# Patient Record
Sex: Male | Born: 1935 | Race: White | Hispanic: No | Marital: Married | Smoking: Former smoker
Health system: Southern US, Community
[De-identification: ages and names within clinical notes are randomized; demographics above are authoritative.]

## PROBLEM LIST (undated history)

## (undated) DIAGNOSIS — R410 Disorientation, unspecified: Secondary | ICD-10-CM

## (undated) DIAGNOSIS — C4491 Basal cell carcinoma of skin, unspecified: Secondary | ICD-10-CM

## (undated) DIAGNOSIS — I10 Essential (primary) hypertension: Secondary | ICD-10-CM

## (undated) DIAGNOSIS — R42 Dizziness and giddiness: Secondary | ICD-10-CM

## (undated) DIAGNOSIS — C439 Malignant melanoma of skin, unspecified: Secondary | ICD-10-CM

## (undated) HISTORY — DX: Disorientation, unspecified: R41.0

## (undated) HISTORY — PX: PROSTATE SURGERY: SHX751

## (undated) HISTORY — PX: HERNIA REPAIR: SHX51

## (undated) HISTORY — PX: APPENDECTOMY: SHX54

## (undated) HISTORY — DX: Malignant melanoma of skin, unspecified: C43.9

## (undated) HISTORY — DX: Basal cell carcinoma of skin, unspecified: C44.91

## (undated) HISTORY — PX: BASAL CELL CARCINOMA EXCISION: SHX1214

## (undated) HISTORY — DX: Dizziness and giddiness: R42

## (undated) HISTORY — PX: MELANOMA EXCISION: SHX5266

---

## 2016-12-28 DIAGNOSIS — R69 Illness, unspecified: Secondary | ICD-10-CM | POA: Diagnosis not present

## 2017-01-10 DIAGNOSIS — R69 Illness, unspecified: Secondary | ICD-10-CM | POA: Diagnosis not present

## 2017-02-02 DIAGNOSIS — R69 Illness, unspecified: Secondary | ICD-10-CM | POA: Diagnosis not present

## 2017-02-08 DIAGNOSIS — R413 Other amnesia: Secondary | ICD-10-CM | POA: Diagnosis not present

## 2017-02-08 DIAGNOSIS — R42 Dizziness and giddiness: Secondary | ICD-10-CM | POA: Diagnosis not present

## 2017-02-08 DIAGNOSIS — Z79899 Other long term (current) drug therapy: Secondary | ICD-10-CM | POA: Diagnosis not present

## 2017-02-08 DIAGNOSIS — R768 Other specified abnormal immunological findings in serum: Secondary | ICD-10-CM | POA: Diagnosis not present

## 2017-02-08 DIAGNOSIS — I10 Essential (primary) hypertension: Secondary | ICD-10-CM | POA: Diagnosis not present

## 2017-02-08 DIAGNOSIS — B356 Tinea cruris: Secondary | ICD-10-CM | POA: Diagnosis not present

## 2017-02-15 DIAGNOSIS — L300 Nummular dermatitis: Secondary | ICD-10-CM | POA: Diagnosis not present

## 2017-02-15 DIAGNOSIS — D485 Neoplasm of uncertain behavior of skin: Secondary | ICD-10-CM | POA: Diagnosis not present

## 2017-02-15 DIAGNOSIS — L821 Other seborrheic keratosis: Secondary | ICD-10-CM | POA: Diagnosis not present

## 2017-02-15 DIAGNOSIS — B356 Tinea cruris: Secondary | ICD-10-CM | POA: Diagnosis not present

## 2017-03-21 DIAGNOSIS — R69 Illness, unspecified: Secondary | ICD-10-CM | POA: Diagnosis not present

## 2017-03-22 DIAGNOSIS — I10 Essential (primary) hypertension: Secondary | ICD-10-CM | POA: Diagnosis not present

## 2017-03-22 DIAGNOSIS — R42 Dizziness and giddiness: Secondary | ICD-10-CM | POA: Diagnosis not present

## 2017-03-22 DIAGNOSIS — R413 Other amnesia: Secondary | ICD-10-CM | POA: Diagnosis not present

## 2017-03-31 DIAGNOSIS — L570 Actinic keratosis: Secondary | ICD-10-CM | POA: Diagnosis not present

## 2017-03-31 DIAGNOSIS — L821 Other seborrheic keratosis: Secondary | ICD-10-CM | POA: Diagnosis not present

## 2017-03-31 DIAGNOSIS — B356 Tinea cruris: Secondary | ICD-10-CM | POA: Diagnosis not present

## 2017-03-31 DIAGNOSIS — L300 Nummular dermatitis: Secondary | ICD-10-CM | POA: Diagnosis not present

## 2017-05-18 DIAGNOSIS — I10 Essential (primary) hypertension: Secondary | ICD-10-CM | POA: Diagnosis not present

## 2017-05-18 DIAGNOSIS — M542 Cervicalgia: Secondary | ICD-10-CM | POA: Diagnosis not present

## 2017-05-18 DIAGNOSIS — R001 Bradycardia, unspecified: Secondary | ICD-10-CM | POA: Diagnosis not present

## 2017-05-18 DIAGNOSIS — R209 Unspecified disturbances of skin sensation: Secondary | ICD-10-CM | POA: Diagnosis not present

## 2017-05-18 DIAGNOSIS — R440 Auditory hallucinations: Secondary | ICD-10-CM | POA: Diagnosis not present

## 2017-05-18 DIAGNOSIS — M25473 Effusion, unspecified ankle: Secondary | ICD-10-CM | POA: Diagnosis not present

## 2017-05-18 DIAGNOSIS — R42 Dizziness and giddiness: Secondary | ICD-10-CM | POA: Diagnosis not present

## 2017-05-18 DIAGNOSIS — Z85820 Personal history of malignant melanoma of skin: Secondary | ICD-10-CM | POA: Diagnosis not present

## 2017-05-18 DIAGNOSIS — R413 Other amnesia: Secondary | ICD-10-CM | POA: Diagnosis not present

## 2017-05-18 DIAGNOSIS — L299 Pruritus, unspecified: Secondary | ICD-10-CM | POA: Diagnosis not present

## 2017-07-04 DIAGNOSIS — R05 Cough: Secondary | ICD-10-CM | POA: Diagnosis not present

## 2017-07-04 DIAGNOSIS — R270 Ataxia, unspecified: Secondary | ICD-10-CM | POA: Diagnosis not present

## 2017-07-04 DIAGNOSIS — B360 Pityriasis versicolor: Secondary | ICD-10-CM | POA: Diagnosis not present

## 2017-07-08 DIAGNOSIS — G459 Transient cerebral ischemic attack, unspecified: Secondary | ICD-10-CM | POA: Diagnosis not present

## 2017-07-08 DIAGNOSIS — R270 Ataxia, unspecified: Secondary | ICD-10-CM | POA: Diagnosis not present

## 2017-07-08 DIAGNOSIS — R42 Dizziness and giddiness: Secondary | ICD-10-CM | POA: Diagnosis not present

## 2017-08-02 DIAGNOSIS — H9313 Tinnitus, bilateral: Secondary | ICD-10-CM | POA: Diagnosis not present

## 2017-08-02 DIAGNOSIS — R42 Dizziness and giddiness: Secondary | ICD-10-CM | POA: Diagnosis not present

## 2017-08-02 DIAGNOSIS — Z87891 Personal history of nicotine dependence: Secondary | ICD-10-CM | POA: Diagnosis not present

## 2017-08-02 DIAGNOSIS — H90A22 Sensorineural hearing loss, unilateral, left ear, with restricted hearing on the contralateral side: Secondary | ICD-10-CM | POA: Diagnosis not present

## 2017-08-08 DIAGNOSIS — B360 Pityriasis versicolor: Secondary | ICD-10-CM | POA: Diagnosis not present

## 2017-08-08 DIAGNOSIS — I10 Essential (primary) hypertension: Secondary | ICD-10-CM | POA: Diagnosis not present

## 2017-09-14 DIAGNOSIS — Z23 Encounter for immunization: Secondary | ICD-10-CM | POA: Diagnosis not present

## 2017-09-14 DIAGNOSIS — B356 Tinea cruris: Secondary | ICD-10-CM | POA: Diagnosis not present

## 2017-09-14 DIAGNOSIS — M7551 Bursitis of right shoulder: Secondary | ICD-10-CM | POA: Diagnosis not present

## 2017-11-29 DIAGNOSIS — R42 Dizziness and giddiness: Secondary | ICD-10-CM | POA: Diagnosis not present

## 2017-11-29 DIAGNOSIS — J40 Bronchitis, not specified as acute or chronic: Secondary | ICD-10-CM | POA: Diagnosis not present

## 2017-11-29 DIAGNOSIS — B889 Infestation, unspecified: Secondary | ICD-10-CM | POA: Diagnosis not present

## 2020-05-21 ENCOUNTER — Encounter (HOSPITAL_COMMUNITY): Payer: Self-pay | Admitting: Emergency Medicine

## 2020-05-21 ENCOUNTER — Emergency Department (HOSPITAL_COMMUNITY): Payer: Medicare HMO

## 2020-05-21 ENCOUNTER — Emergency Department (HOSPITAL_COMMUNITY)
Admission: EM | Admit: 2020-05-21 | Discharge: 2020-05-22 | Disposition: A | Discharge status: Home / Self Care | Payer: Medicare HMO | Attending: Emergency Medicine | Admitting: Emergency Medicine

## 2020-05-21 ENCOUNTER — Other Ambulatory Visit: Payer: Self-pay

## 2020-05-21 DIAGNOSIS — R918 Other nonspecific abnormal finding of lung field: Secondary | ICD-10-CM | POA: Insufficient documentation

## 2020-05-21 DIAGNOSIS — R42 Dizziness and giddiness: Secondary | ICD-10-CM | POA: Insufficient documentation

## 2020-05-21 DIAGNOSIS — R410 Disorientation, unspecified: Secondary | ICD-10-CM | POA: Insufficient documentation

## 2020-05-21 HISTORY — DX: Essential (primary) hypertension: I10

## 2020-05-21 LAB — COMPREHENSIVE METABOLIC PANEL
ALT: 24 U/L (ref 0–44)
AST: 28 U/L (ref 15–41)
Albumin: 3.7 g/dL (ref 3.5–5.0)
Alkaline Phosphatase: 72 U/L (ref 38–126)
Anion gap: 8 (ref 5–15)
BUN: 19 mg/dL (ref 8–23)
CO2: 24 mmol/L (ref 22–32)
Calcium: 8.8 mg/dL — ABNORMAL LOW (ref 8.9–10.3)
Chloride: 108 mmol/L (ref 98–111)
Creatinine, Ser: 1.6 mg/dL — ABNORMAL HIGH (ref 0.61–1.24)
GFR calc Af Amer: 45 mL/min — ABNORMAL LOW (ref >60)
GFR calc non Af Amer: 38 mL/min — ABNORMAL LOW (ref >60)
Glucose, Bld: 111 mg/dL — ABNORMAL HIGH (ref 70–99)
Potassium: 3.6 mmol/L (ref 3.5–5.1)
Sodium: 140 mmol/L (ref 135–145)
Total Bilirubin: 0.6 mg/dL (ref 0.3–1.2)
Total Protein: 6.5 g/dL (ref 6.5–8.1)

## 2020-05-21 LAB — CBC
HCT: 49.1 % (ref 39.0–52.0)
Hemoglobin: 15.3 g/dL (ref 13.0–17.0)
MCH: 29 pg (ref 26.0–34.0)
MCHC: 31.2 g/dL (ref 30.0–36.0)
MCV: 93 fL (ref 80.0–100.0)
Platelets: 251 10*3/uL (ref 150–400)
RBC: 5.28 MIL/uL (ref 4.22–5.81)
RDW: 14.7 % (ref 11.5–15.5)
WBC: 9.8 10*3/uL (ref 4.0–10.5)
nRBC: 0 % (ref 0.0–0.2)

## 2020-05-21 MED ORDER — IOHEXOL 350 MG/ML SOLN
60.0000 mL | Freq: Once | INTRAVENOUS | Status: AC | PRN
  Administered 2020-05-21: 60 mL via INTRAVENOUS

## 2020-05-21 MED ORDER — SODIUM CHLORIDE 0.9% FLUSH
3.0000 mL | Freq: Once | INTRAVENOUS | Status: AC
  Administered 2020-05-21: 3 mL via INTRAVENOUS

## 2020-05-21 MED ORDER — LORAZEPAM 2 MG/ML IJ SOLN
0.5000 mg | Freq: Once | INTRAMUSCULAR | Status: AC
  Administered 2020-05-22: 0.5 mg via INTRAVENOUS
  Filled 2020-05-21: qty 1

## 2020-05-21 NOTE — ED Provider Notes (Signed)
11:50 PM Assumed care from Dr. Maryan Rued, please see their note for full history, physical and decision making until this point. In brief this is a 86 y.o. year old male who presented to the ED tonight with Dizziness and Altered Mental Status     Intermittent spells of 'staring off into space'. Pending MRI for stroke. If negative, could it be seizures with missing brain? D/w neurology.  MRI as below, d/w neurology who stated this could be a seizure activity versus amyloid spells.  Did not think that the subacute area anything to do with in order to require admission to the hospital.  Patient started on Keppra per their recommendations with outpatient neurology follow-up.  Discussed this with the patient and the daughter and they are very appreciative of the care given here in the emergency room.  Discharge instructions, including strict return precautions for new or worsening symptoms, given. Patient and/or family verbalized understanding and agreement with the plan as described.   Labs, studies and imaging reviewed by myself and considered in medical decision making if ordered. Imaging interpreted by radiology.  Labs Reviewed  COMPREHENSIVE METABOLIC PANEL - Abnormal; Notable for the following components:      Result Value   Glucose, Bld 111 (*)    Creatinine, Ser 1.60 (*)    Calcium 8.8 (*)    GFR calc non Af Amer 38 (*)    GFR calc Af Amer 45 (*)    All other components within normal limits  CBC    CT Angio Head W or Wo Contrast  Final Result    CT Angio Neck W and/or Wo Contrast  Final Result    MR BRAIN WO CONTRAST    (Results Pending)  DG Chest Port 1 View    (Results Pending)    No follow-ups on file.    Sheryl Saintil, Corene Cornea, MD 05/22/20 [phone]

## 2020-05-21 NOTE — ED Provider Notes (Signed)
[facility]PART[facility] Provider Note   CSN: [id number] Arrival date & time: 05/21/20  1740     History Chief Complaint  Patient presents with  . Dizziness  . Altered Mental Status    Tyler Hardin is a 86 y.o. male.  Patient is an 86 year old male with hx of atrial fibrillation not on anticoagulation, hearing loss, HTN, melanoma and sleep apnea who is presenting today at the request of his PCP for intermittent episodes of confusion.  Patient is present with his daughter who gives most of the history.  She reports for the last 1 week he has had intermittent episodes of confusion.  He reports he cannot remember where he is at and cannot remember details that he would normally be able to remember.  This causes him to be very frustrated and it usually lasts no longer than 30 minutes.  He has had up to 3 episodes in 1 day but reports that they are starting to become more frequent.  The longest episode has been 30 minutes and the shortest has been 5 to 10 minutes.  During these episodes daughter denies ever seeing him have any slurred speech, facial droop, unilateral weakness or numbness.  However patient states this week he is also noticed severe vertigo and sometimes difficulty walking.  Patient reports that he has dealt with vertigo for up to 20 years and there are times during his vertigo he is unable to walk and unable to write and sometimes even has to close his eyes.  He has had multiple work-ups for this and nobody has ever been able to help him and make the symptoms go away.  He reports the vertigo he is experienced this week seems to be similar to past episodes.  He denies any recent medication changes.  No recent illness with cough, congestion, shortness of breath, chest pain, palpitations, abdominal pain, nausea, vomiting or diarrhea.  His diet has been the same.  Patient's last MRI was in November of last year which showed no acute changes.  He had a urine done in  the doctor's office today that showed no evidence of infection.  The history is provided by the patient and a relative.  Dizziness Quality:  Imbalance and vertigo Severity:  Moderate Onset quality:  Gradual Duration: states he has had vertigo for 10-20 years but seems worse this week with the episodes of confusion. Timing:  Intermittent Progression:  Worsening Chronicity:  Recurrent Associated symptoms: no chest pain, no diarrhea, no headaches, no nausea, no palpitations, no shortness of breath, no syncope, no vision changes, no vomiting and no weakness   Risk factors: hx of stroke and hx of vertigo   Risk factors comment:  Htn Altered Mental Status Associated symptoms: no headaches, no nausea, no palpitations, no visual change, no vomiting and no weakness        Past Medical History:  Diagnosis Date  . Hypertension     There are no problems to display for this patient.        No family history on file.  Social History   Tobacco Use  . Smoking status: Not on file  Substance Use Topics  . Alcohol use: Not on file  . Drug use: Not on file    Home Medications Prior to Admission medications   Not on File    Allergies    Patient has no known allergies.  Review of Systems   Review of Systems  Respiratory: Negative for shortness of  breath.   Cardiovascular: Negative for chest pain, palpitations and syncope.  Gastrointestinal: Negative for diarrhea, nausea and vomiting.  Neurological: Positive for dizziness. Negative for weakness and headaches.  All other systems reviewed and are negative.   Physical Exam Updated Vital Signs BP (!) 134/94 (BP Location: Right Arm)   Pulse 69   Temp 98.1 F (36.7 C) (Oral)   Resp 14   SpO2 96%   Physical Exam Vitals and nursing note reviewed.  Constitutional:      General: He is not in acute distress.    Appearance: Normal appearance. He is well-developed and normal weight.  HENT:     Head: Normocephalic and  atraumatic.     Nose: Nose normal.     Mouth/Throat:     Mouth: Mucous membranes are moist.  Eyes:     General: No visual field deficit.    Extraocular Movements: Extraocular movements intact.     Conjunctiva/sclera: Conjunctivae normal.     Pupils: Pupils are equal, round, and reactive to light.     Comments: No nystagmus  Cardiovascular:     Rate and Rhythm: Normal rate. Rhythm irregularly irregular.     Pulses: Normal pulses.     Heart sounds: No murmur.  Pulmonary:     Effort: Pulmonary effort is normal. No respiratory distress.     Breath sounds: Normal breath sounds. No wheezing or rales.  Abdominal:     General: There is no distension.     Palpations: Abdomen is soft.     Tenderness: There is no abdominal tenderness. There is no guarding or rebound.  Musculoskeletal:        General: No tenderness. Normal range of motion.     Cervical back: Normal range of motion and neck supple.     Right lower leg: No edema.     Left lower leg: No edema.  Skin:    General: Skin is warm and dry.     Findings: No erythema or rash.     Comments: All areas of ecchymosis over the arms in various stages of healing  Neurological:     Mental Status: He is alert and oriented to person, place, and time. Mental status is at baseline.     Cranial Nerves: No dysarthria or facial asymmetry.     Sensory: Sensation is intact.     Motor: Motor function is intact. No weakness or pronator drift.     Coordination: Coordination is intact. Coordination normal. Finger-Nose-Finger Test and Heel to Shin Test normal.     Comments: Slightly unsteady with walking but is able to self-correct  Psychiatric:        Mood and Affect: Mood normal.        Behavior: Behavior normal.        Thought Content: Thought content normal.     ED Results / Procedures / Treatments   Labs (all labs ordered are listed, but only abnormal results are displayed) Labs Reviewed  COMPREHENSIVE METABOLIC PANEL - Abnormal; Notable for  the following components:      Result Value   Glucose, Bld 111 (*)    Creatinine, Ser 1.60 (*)    Calcium 8.8 (*)    GFR calc non Af Amer 38 (*)    GFR calc Af Amer 45 (*)    All other components within normal limits  CBC    EKG EKG Interpretation  Date/Time:  Wednesday May 21 2020 17:46:49 EDT Ventricular Rate:  69 PR Interval:  QRS Duration: 90 QT Interval:  378 QTC Calculation: 405 R Axis:   97 Text Interpretation: Atrial fibrillation Rightward axis Low voltage QRS Septal infarct , age undetermined No previous tracing Confirmed by Blanchie Dessert [phone]) on 05/21/2020 7:04:54 PM   Radiology CT Angio Head W or Wo Contrast  Result Date: 05/21/2020 CLINICAL DATA:  Initial evaluation for acute vertigo. EXAM: CT ANGIOGRAPHY HEAD AND NECK TECHNIQUE: Multidetector CT imaging of the head and neck was performed using the standard protocol during bolus administration of intravenous contrast. Multiplanar CT image reconstructions and MIPs were obtained to evaluate the vascular anatomy. Carotid stenosis measurements (when applicable) are obtained utilizing NASCET criteria, using the distal internal carotid diameter as the denominator. CONTRAST:  9mL OMNIPAQUE IOHEXOL 350 MG/ML SOLN COMPARISON:  Prior MRI from 07/08/2017. FINDINGS: CT HEAD FINDINGS Brain: Generalized age-related cerebral atrophy with mild chronic small vessel ischemic disease. Remote lacunar infarct present at the right basal ganglia. Encephalomalacia of involving the left occipital lobe compatible with a chronic left PCA territory infarct. No acute intracranial hemorrhage. No acute large vessel territory infarct. No mass lesion, midline shift or mass effect. Mild ventricular prominence related global parenchymal volume loss without hydrocephalus. No extra-axial fluid collection. Vascular: No hyperdense vessel. Calcified atherosclerosis present at skull base. Skull: Scalp soft tissues within normal limits.  Calvarium intact.  Sinuses: Paranasal sinuses are largely clear. No mastoid effusion. Orbits: Globes and orbital soft tissues within normal limits. Review of the MIP images confirms the above findings CTA NECK FINDINGS Aortic arch: Visualized aortic arch within normal limits for caliber with normal branch pattern. Moderate atheromatous change within the arch itself and about the origin of the great vessels without hemodynamically significant stenosis. Visualized subclavian arteries widely patent. Right carotid system: Right CCA tortuous proximally but widely patent to the bifurcation without stenosis. Bulky calcified plaque about the right bifurcation without hemodynamically significant stenosis. Right ICA mildly tortuous but widely patent distally to the skull base without stenosis, dissection or occlusion. Left carotid system: Left CCA tortuous proximally but widely patent to the bifurcation. Mild to moderate atheromatous plaque about the left bifurcation/proximal left ICA without significant stenosis. Left ICA widely patent distally without stenosis, dissection or occlusion. Vertebral arteries: Both vertebral arteries arise from the subclavian arteries. Vertebral arteries widely patent within the neck without stenosis, dissection or occlusion. Skeleton: No acute osseous abnormality. No discrete or worrisome osseous lesions. Mild-to-moderate multilevel cervical spondylosis without high-grade stenosis. Other neck: No other acute soft tissue abnormality within the neck. No mass lesion or adenopathy. Upper chest: Irregular consolidative opacity seen at the posterior left upper lobe, concerning for pneumonia (series 9, image 161). Visualized upper chest demonstrates no other acute finding. Review of the MIP images confirms the above findings CTA HEAD FINDINGS Anterior circulation: Petrous segments widely patent. Mild atheromatous change within the cavernous/supraclinoid ICAs bilaterally. Associated mild to moderate stenoses seen  involving the supraclinoid ICAs bilaterally, right slightly worse than left. A1 segments widely patent. Normal anterior communicating artery complex. Anterior cerebral arteries widely patent to their distal aspects without stenosis. No M1 stenosis or occlusion. Normal MCA bifurcations. Distal MCA branches well perfused and symmetric. Posterior circulation: Mild scattered non stenotic plaque noted within the V4 segments bilaterally without stenosis. Both picas patent. Basilar somewhat dolichoectatic and widely patent to its distal aspect without stenosis. Superior cerebral arteries patent bilaterally. Both PCAs primarily supplied via the basilar. Mild atheromatous irregularity within the PCAs bilaterally without high-grade stenosis. Venous sinuses: Grossly patent allowing for timing the contrast bolus.  Anatomic variants: None significant.  No intracranial aneurysm. Review of the MIP images confirms the above findings IMPRESSION: CT HEAD IMPRESSION: 1. No acute intracranial abnormality. 2. Chronic left PCA territory infarct, with additional remote lacunar infarct involving the right basal ganglia. 3. Underlying moderately advanced age-related cerebral atrophy with chronic small vessel ischemic disease. CTA HEAD AND NECK IMPRESSION: 1. Negative CTA for large vessel occlusion or other acute vascular abnormality. 2. Mild atheromatous change about the carotid siphons with associated mild to moderate stenoses, right slightly worse than left. 3. Additional mild for age atheromatous plaque elsewhere about the major arterial vasculature of the head and neck. No other hemodynamically significant or correctable stenosis. 4. Mild vertebrobasilar dolichoectasia. 5. Irregular left upper lobe infiltrate, concerning for pneumonia. Correlation with plain film radiography suggested. Electronically Signed   By: Jeannine Boga M.D.   On: 05/21/2020 23:06   CT Angio Neck W and/or Wo Contrast  Result Date: 05/21/2020 CLINICAL  DATA:  Initial evaluation for acute vertigo. EXAM: CT ANGIOGRAPHY HEAD AND NECK TECHNIQUE: Multidetector CT imaging of the head and neck was performed using the standard protocol during bolus administration of intravenous contrast. Multiplanar CT image reconstructions and MIPs were obtained to evaluate the vascular anatomy. Carotid stenosis measurements (when applicable) are obtained utilizing NASCET criteria, using the distal internal carotid diameter as the denominator. CONTRAST:  4mL OMNIPAQUE IOHEXOL 350 MG/ML SOLN COMPARISON:  Prior MRI from 07/08/2017. FINDINGS: CT HEAD FINDINGS Brain: Generalized age-related cerebral atrophy with mild chronic small vessel ischemic disease. Remote lacunar infarct present at the right basal ganglia. Encephalomalacia of involving the left occipital lobe compatible with a chronic left PCA territory infarct. No acute intracranial hemorrhage. No acute large vessel territory infarct. No mass lesion, midline shift or mass effect. Mild ventricular prominence related global parenchymal volume loss without hydrocephalus. No extra-axial fluid collection. Vascular: No hyperdense vessel. Calcified atherosclerosis present at skull base. Skull: Scalp soft tissues within normal limits.  Calvarium intact. Sinuses: Paranasal sinuses are largely clear. No mastoid effusion. Orbits: Globes and orbital soft tissues within normal limits. Review of the MIP images confirms the above findings CTA NECK FINDINGS Aortic arch: Visualized aortic arch within normal limits for caliber with normal branch pattern. Moderate atheromatous change within the arch itself and about the origin of the great vessels without hemodynamically significant stenosis. Visualized subclavian arteries widely patent. Right carotid system: Right CCA tortuous proximally but widely patent to the bifurcation without stenosis. Bulky calcified plaque about the right bifurcation without hemodynamically significant stenosis. Right ICA  mildly tortuous but widely patent distally to the skull base without stenosis, dissection or occlusion. Left carotid system: Left CCA tortuous proximally but widely patent to the bifurcation. Mild to moderate atheromatous plaque about the left bifurcation/proximal left ICA without significant stenosis. Left ICA widely patent distally without stenosis, dissection or occlusion. Vertebral arteries: Both vertebral arteries arise from the subclavian arteries. Vertebral arteries widely patent within the neck without stenosis, dissection or occlusion. Skeleton: No acute osseous abnormality. No discrete or worrisome osseous lesions. Mild-to-moderate multilevel cervical spondylosis without high-grade stenosis. Other neck: No other acute soft tissue abnormality within the neck. No mass lesion or adenopathy. Upper chest: Irregular consolidative opacity seen at the posterior left upper lobe, concerning for pneumonia (series 9, image 161). Visualized upper chest demonstrates no other acute finding. Review of the MIP images confirms the above findings CTA HEAD FINDINGS Anterior circulation: Petrous segments widely patent. Mild atheromatous change within the cavernous/supraclinoid ICAs bilaterally. Associated mild to moderate stenoses seen  involving the supraclinoid ICAs bilaterally, right slightly worse than left. A1 segments widely patent. Normal anterior communicating artery complex. Anterior cerebral arteries widely patent to their distal aspects without stenosis. No M1 stenosis or occlusion. Normal MCA bifurcations. Distal MCA branches well perfused and symmetric. Posterior circulation: Mild scattered non stenotic plaque noted within the V4 segments bilaterally without stenosis. Both picas patent. Basilar somewhat dolichoectatic and widely patent to its distal aspect without stenosis. Superior cerebral arteries patent bilaterally. Both PCAs primarily supplied via the basilar. Mild atheromatous irregularity within the PCAs  bilaterally without high-grade stenosis. Venous sinuses: Grossly patent allowing for timing the contrast bolus. Anatomic variants: None significant.  No intracranial aneurysm. Review of the MIP images confirms the above findings IMPRESSION: CT HEAD IMPRESSION: 1. No acute intracranial abnormality. 2. Chronic left PCA territory infarct, with additional remote lacunar infarct involving the right basal ganglia. 3. Underlying moderately advanced age-related cerebral atrophy with chronic small vessel ischemic disease. CTA HEAD AND NECK IMPRESSION: 1. Negative CTA for large vessel occlusion or other acute vascular abnormality. 2. Mild atheromatous change about the carotid siphons with associated mild to moderate stenoses, right slightly worse than left. 3. Additional mild for age atheromatous plaque elsewhere about the major arterial vasculature of the head and neck. No other hemodynamically significant or correctable stenosis. 4. Mild vertebrobasilar dolichoectasia. 5. Irregular left upper lobe infiltrate, concerning for pneumonia. Correlation with plain film radiography suggested. Electronically Signed   By: Jeannine Boga M.D.   On: 05/21/2020 23:06    Procedures Procedures (including critical care time)  Medications Ordered in ED Medications  sodium chloride flush (NS) 0.9 % injection 3 mL (has no administration in time range)    ED Course  I have reviewed the triage vital signs and the nursing notes.  Pertinent labs & imaging results that were available during my care of the patient were reviewed by me and considered in my medical decision making (see chart for details).    MDM Rules/Calculators/A&P                      Elderly male presenting today with 1 week of intermittent episodes of confusion as well as worsening symptoms of vertigo.  Patient was seen by his PCP today who was concerned and sent him here for further evaluation.  Patient is currently asymptomatic with a relatively normal  neuro exam except for mild unsteady gait that he can self-correct for.  However concern for possible TIA as patient symptoms have become more frequent.  Does not appear to be hallucinating or having episodes of delirium concerning for encephalopathy.  He has had no recent medication changes and denies any alcohol or drug use.  Patient does have atrial fibrillation and is not anticoagulated due to the cost of medication causing him to be at much higher stroke risk.  He is currently in atrial fibrillation today rate controlled.  Labs show a creatinine of 1.6 which is baseline compared to readings from outside our facility in the past.  We will get a CTA of the head and neck for further evaluation.  11:37 PM CT was negative for large vessel occlusion.  It did show some mild changes but no significant stenosis.  Patient did show irregular left upper lobe infiltrate concerning for pneumonia.  We will do a chest x-ray to confirm.  Patient's head CT showed chronic left PCA infarct with remote lacunar infarct and moderately advanced age-related cerebral atrophy.  Given patient's new symptoms we will  do MRI to rule out acute infarct.  However speaking with the patient and his daughter during these episodes he occasionally is staring off into space and concerned that may be he is having intermittent partial complex seizures.  Will discuss patient and MRI findings with neurology for further recommendations.  Final Clinical Impression(s) / ED Diagnoses Final diagnoses:  None    Rx / DC Orders ED Discharge Orders    None       Blanchie Dessert, MD 05/21/20 2338

## 2020-05-21 NOTE — ED Triage Notes (Signed)
Pt arrives to ED after seeing PCP due to episodes of confusion that started last week, pt had moments where he did not recognize family members. At this time pt is alert and ox4. Pt states he has a hx of vertigo and has been dizzy for the last 3 days. No weakness or numbness.

## 2020-05-21 NOTE — ED Notes (Signed)
Pt transported to CT ?

## 2020-05-22 ENCOUNTER — Emergency Department (HOSPITAL_COMMUNITY): Payer: Medicare HMO

## 2020-05-22 MED ORDER — MECLIZINE HCL 25 MG PO TABS
25.0000 mg | ORAL_TABLET | Freq: Three times a day (TID) | ORAL | 0 refills | Status: AC | PRN
Start: 2020-05-22 — End: ?

## 2020-05-22 MED ORDER — LEVETIRACETAM 500 MG PO TABS
500.0000 mg | ORAL_TABLET | Freq: Two times a day (BID) | ORAL | 0 refills | Status: DC
Start: 2020-05-22 — End: 2020-06-04

## 2020-05-22 MED ORDER — LEVETIRACETAM 500 MG PO TABS
1000.0000 mg | ORAL_TABLET | Freq: Once | ORAL | Status: AC
  Administered 2020-05-22: 1000 mg via ORAL
  Filled 2020-05-22: qty 2

## 2020-05-22 NOTE — ED Notes (Signed)
Family at bedside. 

## 2020-05-22 NOTE — Discharge Instructions (Addendum)
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 Please follow up with neurology (should call with appointment) for further management.

## 2020-06-04 ENCOUNTER — Encounter: Payer: Self-pay | Admitting: Neurology

## 2020-06-04 ENCOUNTER — Other Ambulatory Visit: Payer: Self-pay

## 2020-06-04 ENCOUNTER — Ambulatory Visit: Payer: Medicare HMO | Admitting: Neurology

## 2020-06-04 VITALS — BP 132/88 | HR 69 | Ht 68.0 in | Wt 203.5 lb

## 2020-06-04 DIAGNOSIS — R269 Unspecified abnormalities of gait and mobility: Secondary | ICD-10-CM | POA: Insufficient documentation

## 2020-06-04 DIAGNOSIS — G40209 Localization-related (focal) (partial) symptomatic epilepsy and epileptic syndromes with complex partial seizures, not intractable, without status epilepticus: Secondary | ICD-10-CM | POA: Insufficient documentation

## 2020-06-04 DIAGNOSIS — I639 Cerebral infarction, unspecified: Secondary | ICD-10-CM | POA: Insufficient documentation

## 2020-06-04 MED ORDER — LEVETIRACETAM 500 MG PO TABS: 500.0000 mg | ORAL_TABLET | Freq: Two times a day (BID) | ORAL | 4 refills | Status: AC

## 2020-06-04 NOTE — Progress Notes (Signed)
PATIENT: Tyler Hardin DOB: [birth year]  Chief Complaint  Patient presents with  . Altered Mental Status    He is here with his daughter, Tyler Hardin. Reports intermittent confusion episodes that last 30 minutes to half day. Denies memory loss. Symptoms started one week prior to an ED visit on 05/21/20. He was given Keppra but wanted to hold off starting it until seen here. He would like to review his abnormal MRI findings. He currently is on antibiotic for pneumonia. Hx of vertgo for 20+ years.  Marland Kitchen PCP    Tyler Broker, MD     [place] is a 86 year old male is accompanied by his daughter Tyler Hardin seen in request by his primary care physician Dr. Herbie Hardin droppings for evaluation of intermittent confusion, initial evaluation was on June 04, 2020  I reviewed and summarized the referring note.  He had past medical history of hypertension, also had a history of right arm melanoma surgery in the past, history of atrial fibrillation, not on any anticoagulation, hearing loss,  He presented to the emergency room May 21, 2020 for intermittent confusion, his daughter noticed that since end of May, a week prior to his ED presentation, he was noted to have intermittent confusion spells, patient also reported that he seems to notice a slip of memory, which is worsening that his baseline  Patient is a retired Armed forces operational officer, used to enjoy motorcycle, he was highly function, very active, but since February 2020, he had prostate surgery he was noted to have slow decline,  At times he woke up from his dream, he cannot tell reality from his dream, this week he woke up from overnight sleep, he could not recognize his wife, very confused, lasting for 30 minutes, then gradually recovered to his baseline  He used to enjoy flying airplane, but few years ago, while he was flying, he had sudden onset mild confusion spells, could not find his way out, he has to give it up  Since 2019, he was noted to  have mild unsteady gait, tends to bump things into left side of his body, he also began to have vivid dreams, often have visual hallucinations, see animals, they do not talk, he does move a lot during his sleep, he denied loss sense of smell, he has no incontinence, has good appetite,  I personally reviewed MRI of the brain on May 22, 2020, left PCA infarction with additional remote left cerebellar infarction, generalized atrophy, chronic microhemorrhage scattered through bilateral hemisphere  REVIEW OF SYSTEMS: Full 14 system review of systems performed and notable only for as above All other review of systems were negative.  ALLERGIES: No Known Allergies  HOME MEDICATIONS: Current Outpatient Medications  Medication Sig Dispense Refill  . amLODipine (NORVASC) 10 MG tablet Take 1 tablet by mouth daily.    Marland Kitchen aspirin 325 MG EC tablet Take 1 tablet by mouth every other day.    . cyanocobalamin 1000 MCG tablet Take 1 tablet by mouth every other day.    . levETIRAcetam (KEPPRA) 500 MG tablet Take 1 tablet (500 mg total) by mouth 2 (two) times daily. 60 tablet 0  . levofloxacin (LEVAQUIN) 750 MG tablet Take 1 tablet by mouth daily.    . meclizine (ANTIVERT) 25 MG tablet Take 1 tablet (25 mg total) by mouth 3 (three) times daily as needed for dizziness. 30 tablet 0  . metoprolol succinate (TOPROL-XL) 100 MG 24 hr tablet Take 100 mg by mouth daily.  No current facility-administered medications for this visit.    PAST MEDICAL HISTORY: Past Medical History:  Diagnosis Date  . Basal cell carcinoma    nose\  . Confusion   . Hypertension   . Melanoma ([place])    right arm  . Vertigo    20+ years    PAST SURGICAL HISTORY: Past Surgical History:  Procedure Laterality Date  . APPENDECTOMY    . BASAL CELL CARCINOMA EXCISION     nose  . HERNIA REPAIR    . MELANOMA EXCISION Right    arm  . PROSTATE SURGERY      FAMILY HISTORY: Family History  Problem Relation Age of Onset  .  Transient ischemic attack Mother   . Heart disease Father     SOCIAL HISTORY: Social History   Socioeconomic History  . Marital status: Married    Spouse name: Not on file  . Number of children: 4  . Years of education: some college  . Highest education level: Not on file  Occupational History  . Occupation: Retired  Tobacco Use  . Smoking status: Former Research scientist (life sciences)  . Smokeless tobacco: Never Used  Substance and Sexual Activity  . Alcohol use: Not Currently  . Drug use: Never  . Sexual activity: Not on file  Other Topics Concern  . Not on file  Social History Narrative   Lives at home with wife.   Ambidextrous.   3-4 cups caffeine per day.   Social Determinants of Health   Financial Resource Strain:   . Difficulty of Paying Living Expenses:   Food Insecurity:   . Worried About Charity fundraiser in the Last Year:   . Arboriculturist in the Last Year:   Transportation Needs:   . Film/video editor (Medical):   Marland Kitchen Lack of Transportation (Non-Medical):   Physical Activity:   . Days of Exercise per Week:   . Minutes of Exercise per Session:   Stress:   . Feeling of Stress :   Social Connections:   . Frequency of Communication with Friends and Family:   . Frequency of Social Gatherings with Friends and Family:   . Attends Religious Services:   . Active Member of Clubs or Organizations:   . Attends Archivist Meetings:   Marland Kitchen Marital Status:   Intimate Partner Violence:   . Fear of Current or Ex-Partner:   . Emotionally Abused:   Marland Kitchen Physically Abused:   . Sexually Abused:      PHYSICAL EXAM   Vitals:   06/04/20 1430  BP: 132/88  Pulse: 69  Weight: 203 lb 8 oz (92.3 kg)  Height: 5\' 8"  (1.727 m)   Not recorded     Body mass index is 30.94 kg/m.  PHYSICAL EXAMNIATION:  Gen: NAD, conversant, well nourised, well groomed                     Cardiovascular: Regular rate rhythm, no peripheral edema, warm, nontender. Eyes: Conjunctivae clear  without exudates or hemorrhage Neck: Supple, no carotid bruits. Pulmonary: Clear to auscultation bilaterally   NEUROLOGICAL EXAM:  MENTAL STATUS:  Speech:    Speech is normal; fluent and spontaneous with normal comprehension.  Cognition:     Orientation to time, place and person     Normal recent and remote memory     Normal Attention span and concentration     Normal Language, naming, repeating,spontaneous speech     Fund of knowledge  CRANIAL NERVES: CN II: Visual fields are full to confrontation. Pupils are round equal and briskly reactive to light. CN III, IV, VI: extraocular movement are normal. No ptosis. CN V: Facial sensation is intact to light touch CN VII: Face is symmetric with normal eye closure  CN VIII: Hearing is normal to causal conversation. CN IX, X: Phonation is normal. CN XI: Head turning and shoulder shrug are intact  MOTOR: There is no pronator drift of out-stretched arms. Muscle bulk and tone are normal. Muscle strength is normal.  REFLEXES: Reflexes are 2+ and symmetric at the biceps, triceps, knees, and ankles. Plantar responses are flexor.  SENSORY: Intact to light touch, pinprick and vibratory sensation are intact in fingers and toes.  COORDINATION: There is no trunk or limb dysmetria noted.  GAIT/STANCE: He needs push-up to get up from seated position, mildly antalgic Romberg is absent.   DIAGNOSTIC DATA (LABS, IMAGING, TESTING) - I reviewed patient records, labs, notes, testing and imaging myself where available.   ASSESSMENT AND PLAN  Macy is a 86 y.o. male   Left occipital stroke Intermittent confusion spells with visual hallucinations  Possible partial seizure  EEG  Empirically treat Keppra 500 mg twice daily  Aspirin 325 mg daily  Home physical therapy,  Laboratory evaluations  Should complete stroke evaluation with echocardiogram, ultrasound of carotid artery   Marcial Pacas, M.D. Ph.D.  [facility] [address] [place], [place] [address] Ph: [phone] Fax: [phone]  CC: Tyler Broker, MD  Referring Physician

## 2020-06-05 LAB — RPR: RPR Ser Ql: NONREACTIVE

## 2020-06-05 LAB — C-REACTIVE PROTEIN: CRP: 2 mg/L (ref 0–10)

## 2020-06-05 LAB — HGB A1C W/O EAG: Hgb A1c MFr Bld: 6.3 % — ABNORMAL HIGH (ref 4.8–5.6)

## 2020-06-05 LAB — TSH: TSH: 1.79 u[IU]/mL (ref 0.450–4.500)

## 2020-06-05 LAB — VITAMIN B12: Vitamin B-12: 821 pg/mL (ref 232–1245)

## 2020-06-05 LAB — SEDIMENTATION RATE: Sed Rate: 5 mm/hr (ref 0–30)

## 2020-06-12 ENCOUNTER — Ambulatory Visit: Payer: Self-pay | Admitting: Neurology

## 2020-06-16 ENCOUNTER — Telehealth: Payer: Self-pay | Admitting: Neurology

## 2020-06-16 NOTE — Telephone Encounter (Signed)
Patient daughter called wanting to know what the status on his PT because they have not heard anything.   Also, they would like his office notes sent to his Cardiologist and PCP.

## 2020-06-18 NOTE — Telephone Encounter (Signed)
I called and talked to daughter and relayed that I sent records again to [facility] for her. Patient's daughter relayed  That she was going to check with PCP about referral because there office may have been placing one . Patient's daughter will call me back. Hold Referral  Until call back six [facility] , [facility] , [facility] have denied patient so far.

## 2020-06-19 ENCOUNTER — Telehealth: Payer: Self-pay

## 2020-06-19 NOTE — Telephone Encounter (Signed)
I called Debbie back and relayed Medicare has issued a new card SS # '[id number] aren't on the Medicare cards any more . Relayed to Jackelyn Poling what the card would look like Jackelyn Poling would call me back .

## 2020-06-19 NOTE — Telephone Encounter (Signed)
Debbie called back to what else you needed for referral   She found medicare card  Id # is [id number]  Number to call her back on is [phone]

## 2020-07-07 ENCOUNTER — Other Ambulatory Visit: Payer: Medicare HMO

## 2020-07-08 ENCOUNTER — Telehealth: Payer: Self-pay | Admitting: Neurology

## 2020-07-08 NOTE — Telephone Encounter (Signed)
Pt's daughter, Jackelyn Poling LVM; Pt passed away yesterday morning.

## 2020-07-09 ENCOUNTER — Other Ambulatory Visit: Payer: Medicare HMO

## 2020-07-09 ENCOUNTER — Ambulatory Visit: Payer: Self-pay | Admitting: Neurology

## 2020-07-20 DEATH — deceased

## 2020-08-04 ENCOUNTER — Ambulatory Visit: Payer: Medicare HMO | Admitting: Neurology

## 2020-10-11 IMAGING — MR MR HEAD W/O CM
10 of 11 series · 43 of 48 positions shown · non-contrast
Comparison: Prior CTA from 05/21/2020.

CLINICAL DATA: Initial evaluation for acute TIA, confusion.

EXAM:
MRI HEAD WITHOUT CONTRAST
TECHNIQUE: Multiplanar, multiecho pulse sequences of the brain and surrounding
structures were obtained without intravenous contrast.

[Series 5: DWI · axial · 3.0mm · 0.88mm/px · z∈[-96,+50]mm · 10 of 100 slices shown (1 of 4)]
[im 1/100]
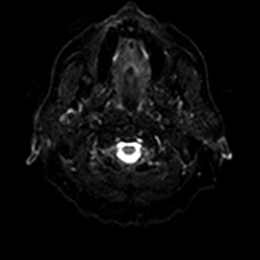
[im 12/100]
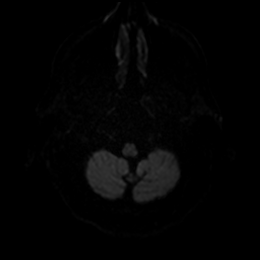
[im 23/100]
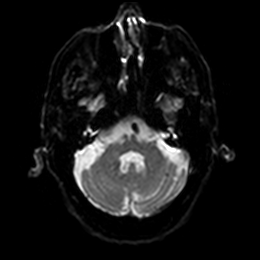
[im 34/100]
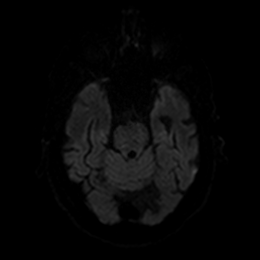
[im 45/100]
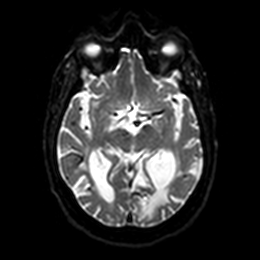
[im 56/100]
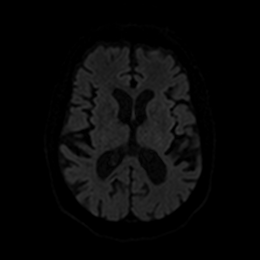
[im 67/100]
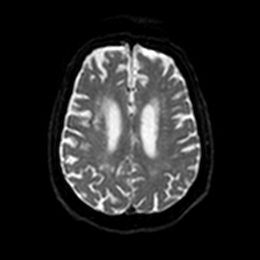
[im 78/100]
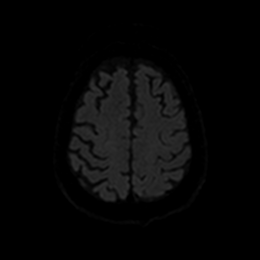
[im 89/100]
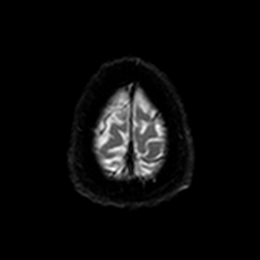
[im 100/100]
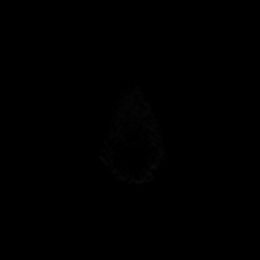

[Series 6: DWI · axial · 3.0mm · 0.88mm/px · z∈[-96,+50]mm · 5 of 50 slices shown (2 of 4)]
[im 1/50]
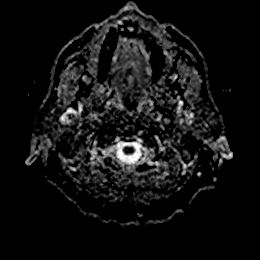
[im 13/50]
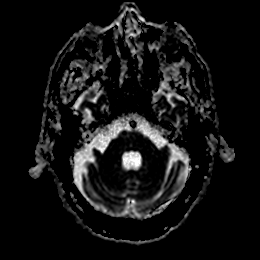
[im 25/50]
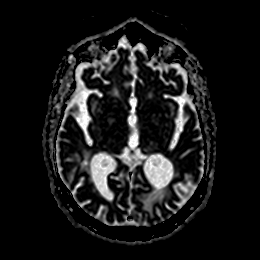
[im 37/50]
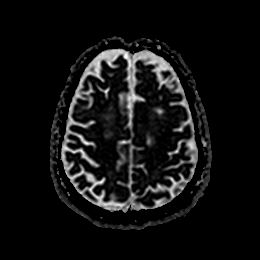
[im 50/50]
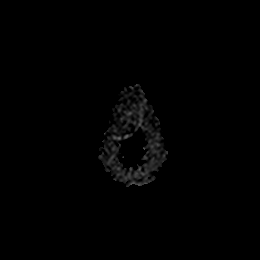

[Series 7: DWI · coronal · 4.0mm · 0.88mm/px · 6 of 64 slices shown (3 of 4)]
[im 1/64]
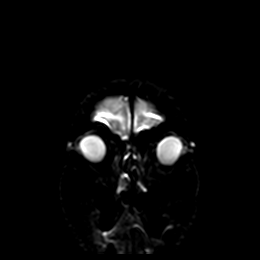
[im 13/64]
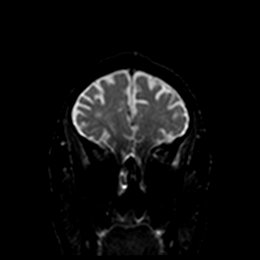
[im 26/64]
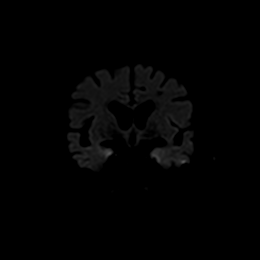
[im 38/64]
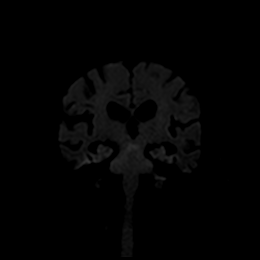
[im 51/64]
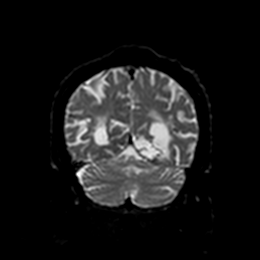
[im 64/64]
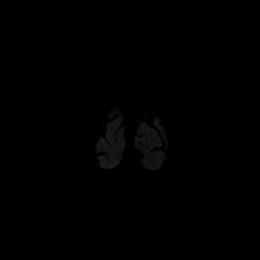

[Series 8: DWI · coronal · 4.0mm · 0.88mm/px · 3 of 32 slices shown (4 of 4)]
[im 1/32]
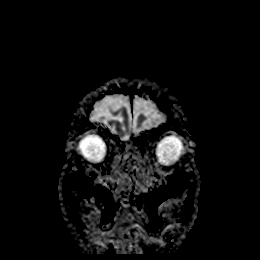
[im 16/32]
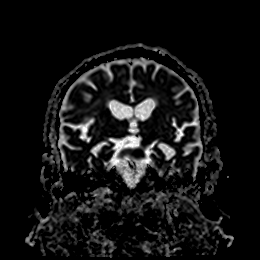
[im 32/32]
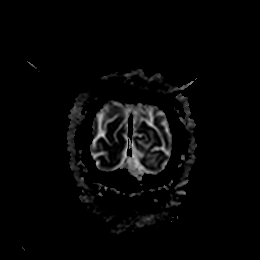

[Series 9: T1 · sagittal · 5.0mm · 0.78mm/px · 2 of 23 slices shown]
[im 1/23]
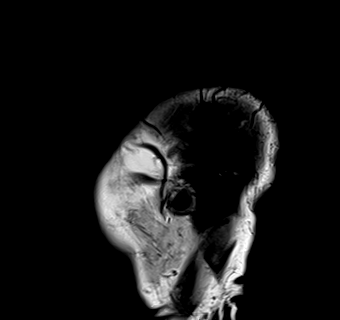
[im 23/23]
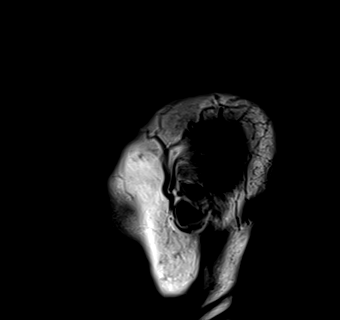

[Series 10: T2 · axial · 5.0mm · 0.72mm/px · z∈[-95,+49]mm · 2 of 25 slices shown (1 of 2)]
[im 1/25]
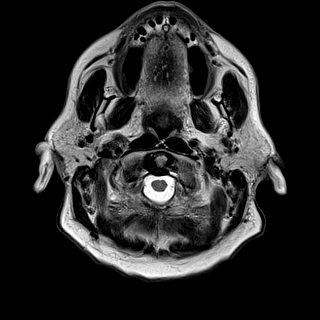
[im 25/25]
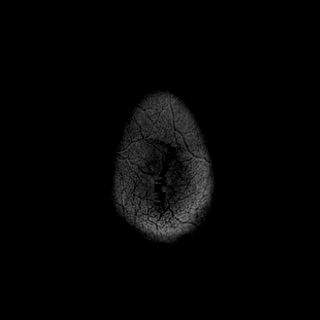

[Series 11: FLAIR · axial · 5.0mm · 0.45mm/px · z∈[-93,+51]mm · 2 of 25 slices shown]
[im 1/25]
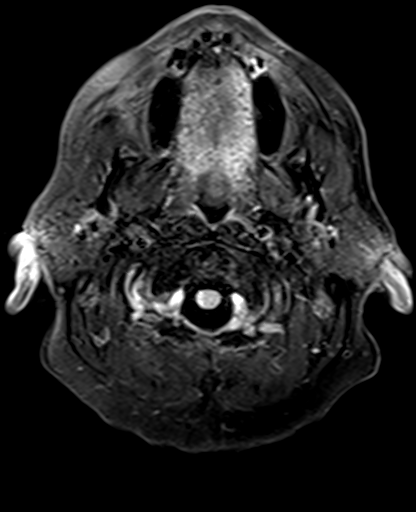
[im 25/25]
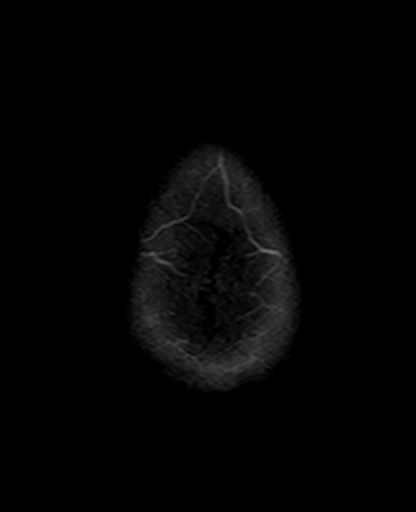

[Series 13: pha_images · axial · 3.0mm · 0.90mm/px · z∈[-109,+64]mm · 5 of 58 slices shown]
[im 1/58]
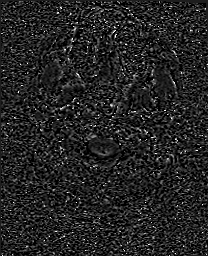
[im 15/58]
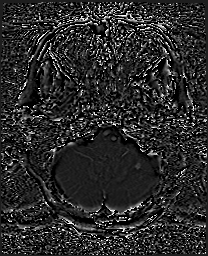
[im 29/58]
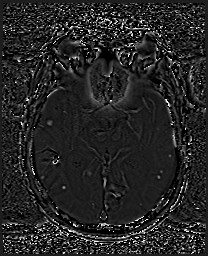
[im 43/58]
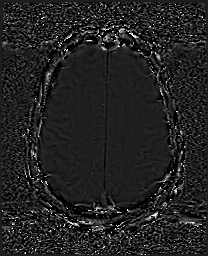
[im 58/58]
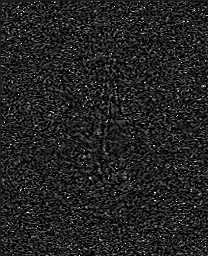

[Series 14: swi_images · axial · 3.0mm · 0.90mm/px · z∈[-109,+67]mm · 5 of 60 slices shown]
[im 1/60]
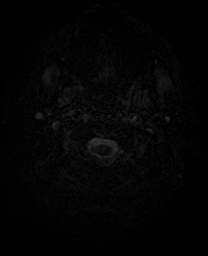
[im 15/60]
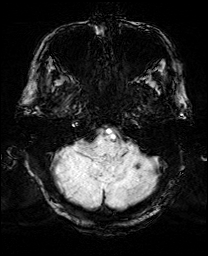
[im 30/60]
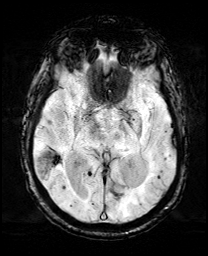
[im 45/60]
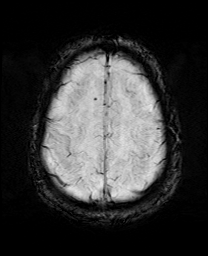
[im 60/60]
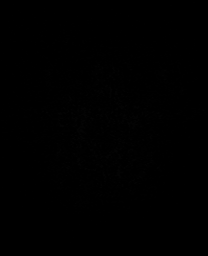

[Series 17: T2 · coronal · 5.0mm · 0.34mm/px · 3 of 29 slices shown (2 of 2)]
[im 1/29]
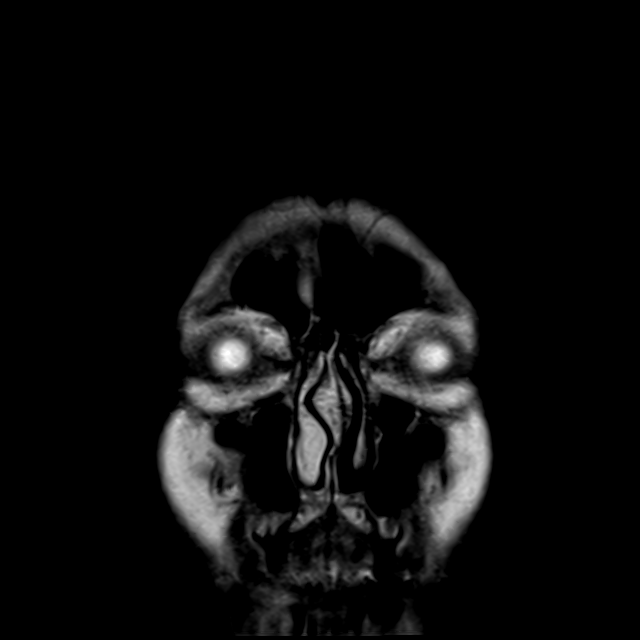
[im 15/29]
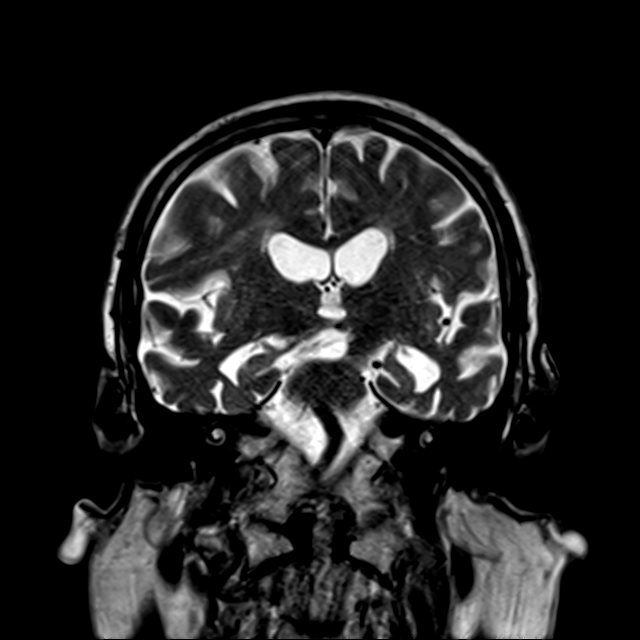
[im 29/29]
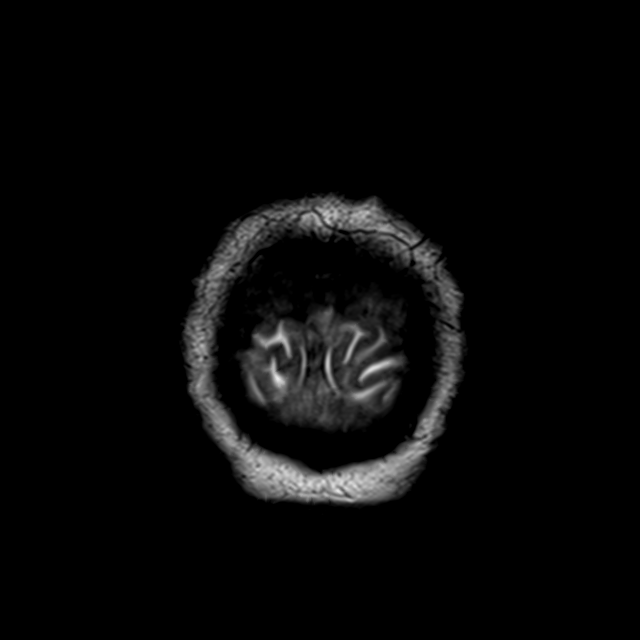

[43 of 48 positions shown; findings below may reference images not displayed]

FINDINGS: Brain: Diffuse prominence of the CSF containing spaces compatible
with generalized age-related cerebral atrophy. Patchy and confluent
T2/FLAIR hyperintensity within the periventricular deep white matter
of both cerebral hemispheres most consistent with chronic small
vessel ischemic disease. Patchy involvement of the pons. Multiple
scatter remote lacunar infarcts present about the deep gray nuclei
and hemispheric cerebral white matter. Few small remote left
cerebellar infarcts noted. Encephalomalacia and gliosis involving
the left occipital lobe compatible with a chronic left PCA territory
infarct.

There is a single faint 3 mm focus of mild diffusion abnormality
involving the deep white matter of the posterior right
frontoparietal centrum semi ovale, consistent with a probable
subacute focus of small vessel ischemia (series 5, image 87). No
associated hemorrhage or mass effect. No other evidence for acute or
subacute ischemia. Punctate diffusion abnormality at the level of
the left caudate felt to be related to adjacent susceptibility
artifact. No acute intracranial hemorrhage. Innumerable chronic
micro hemorrhages seen scattered throughout both cerebral and
cerebellar hemispheres, which could be related to underlying
cerebral amyloid angiopathy and/or hypertension.

No mass lesion, midline shift or mass effect. Diffuse ventricular
prominence related to global parenchymal volume loss without
hydrocephalus. No extra-axial fluid collection. Pituitary gland
suprasellar region within normal limits. Midline structures intact.

Vascular: Major intracranial vascular flow voids are maintained.

Skull and upper cervical spine: Craniocervical junction within
normal limits. Degenerative spondylosis noted at C3-4 with resultant
mild to moderate spinal stenosis. Bone marrow signal intensity
within normal limits. No scalp soft tissue abnormality.

Sinuses/Orbits: Patient status post bilateral ocular lens
replacement. 1 cm T2 hyperintense polypoid lesion at the posterior
right nasal passage could reflect a small polyp versus retained
secretions. Visualized paranasal sinuses are otherwise largely
clear. Trace right mastoid effusion noted, of doubtful significance.

Other: None.
IMPRESSION: 1. 3 mm focus of mild diffusion abnormality involving the deep white
matter of the posterior right centrum semi ovale, consistent with a
small focus of subacute small vessel ischemia. This finding is
likely incidental in nature, and unlikely to be the source of
patient's symptoms.
2. No other acute intracranial abnormality.
3. Chronic left PCA territory infarct, with additional remote left
cerebellar infarcts.
4. Innumerable chronic micro hemorrhages scattered throughout both
cerebral and cerebellar hemispheres, which could be related to
underlying cerebral amyloid angiopathy and/or hypertension.
5. Underlying age-related cerebral atrophy with moderate chronic
small vessel ischemic disease.
